# Patient Record
Sex: Male | Born: 1975 | Race: White | Hispanic: No | Marital: Single | Smoking: Current every day smoker
Health system: Southern US, Community
[De-identification: ages and names within clinical notes are randomized; demographics above are authoritative.]

---

## 2005-12-19 ENCOUNTER — Emergency Department: Payer: Self-pay | Admitting: Emergency Medicine

## 2009-10-25 ENCOUNTER — Emergency Department: Payer: Self-pay | Admitting: Emergency Medicine

## 2009-11-05 ENCOUNTER — Emergency Department: Payer: Self-pay | Admitting: Emergency Medicine

## 2011-08-13 ENCOUNTER — Emergency Department: Payer: Self-pay | Admitting: Emergency Medicine

## 2012-11-17 IMAGING — CR DG HAND COMPLETE 3+V*L*
1 series · 3 of 3 positions shown · non-contrast
Comparison: none

REASON FOR EXAM: trauma, pain, swelling, laceration.
COMMENTS:   LMP: (Male)

[Series 1: pa · 0.17mm/px · 3 of 3 slices shown]
[im 1/3]
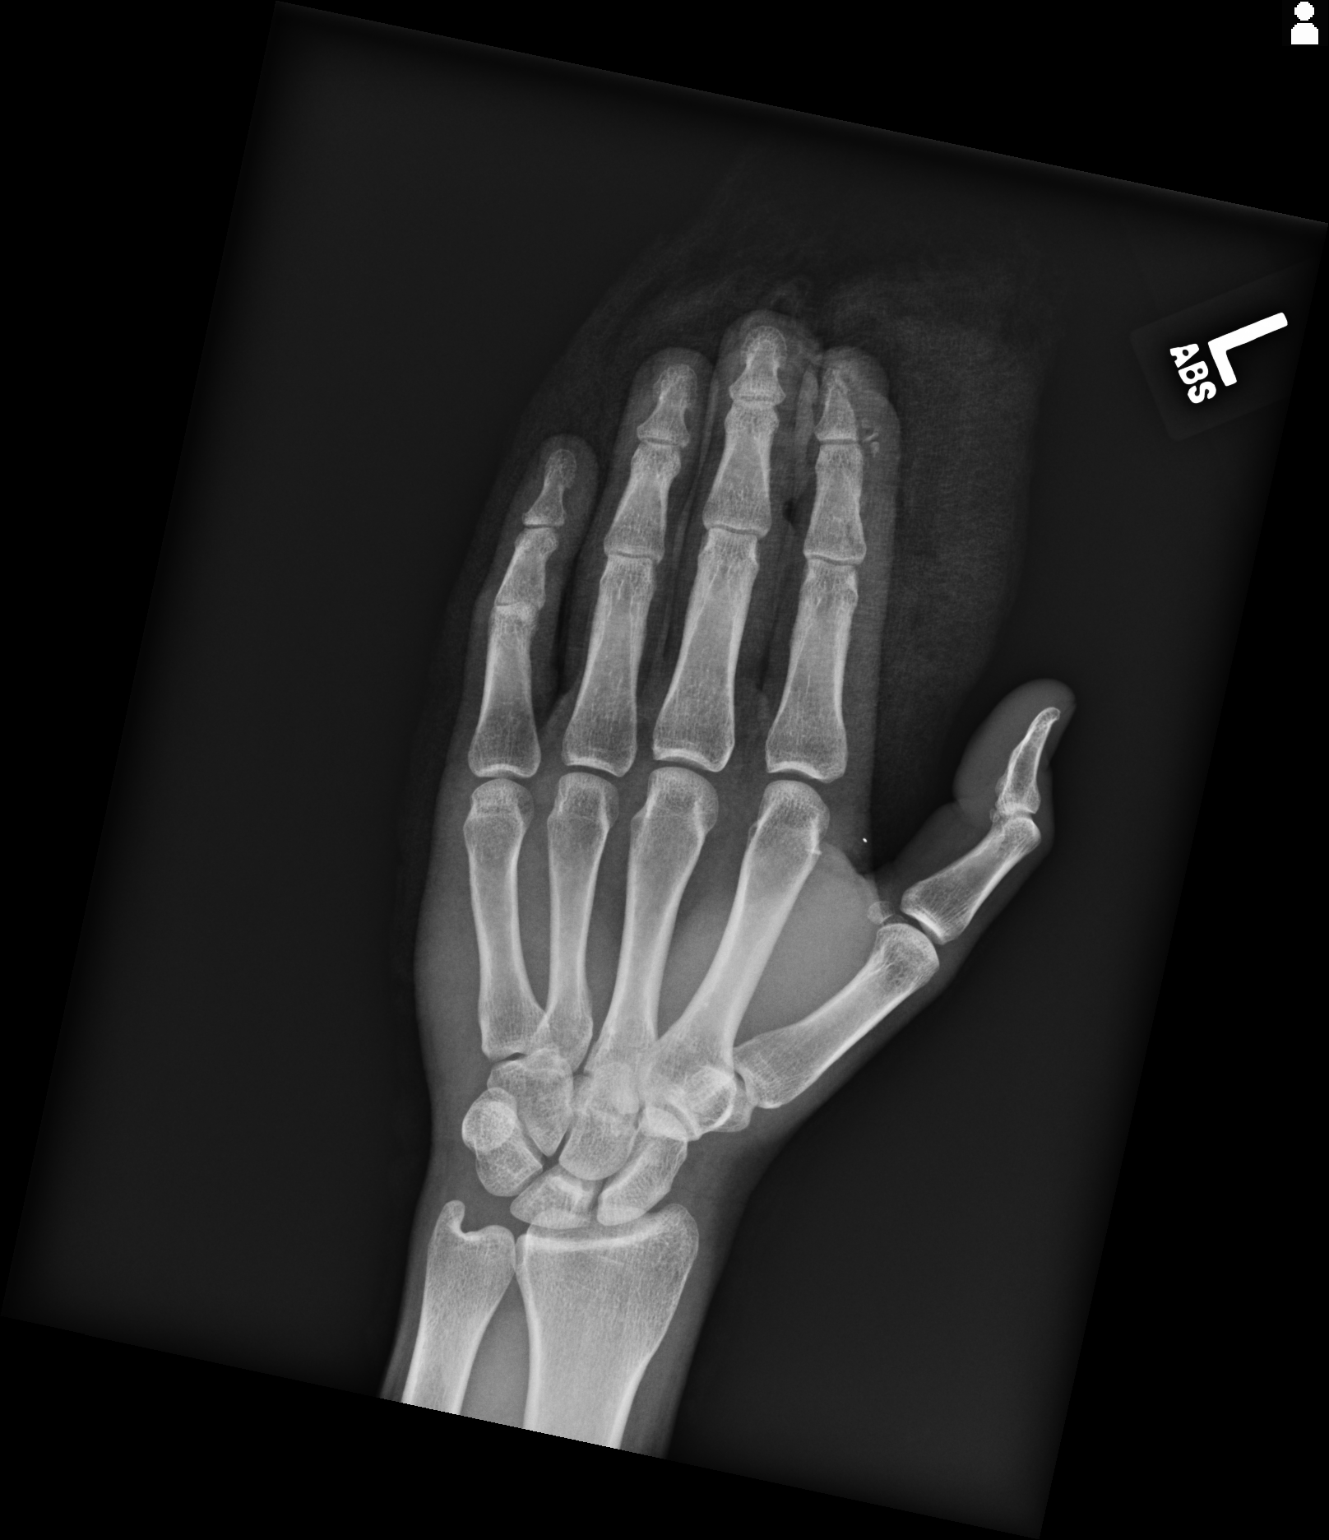
[im 2/3]
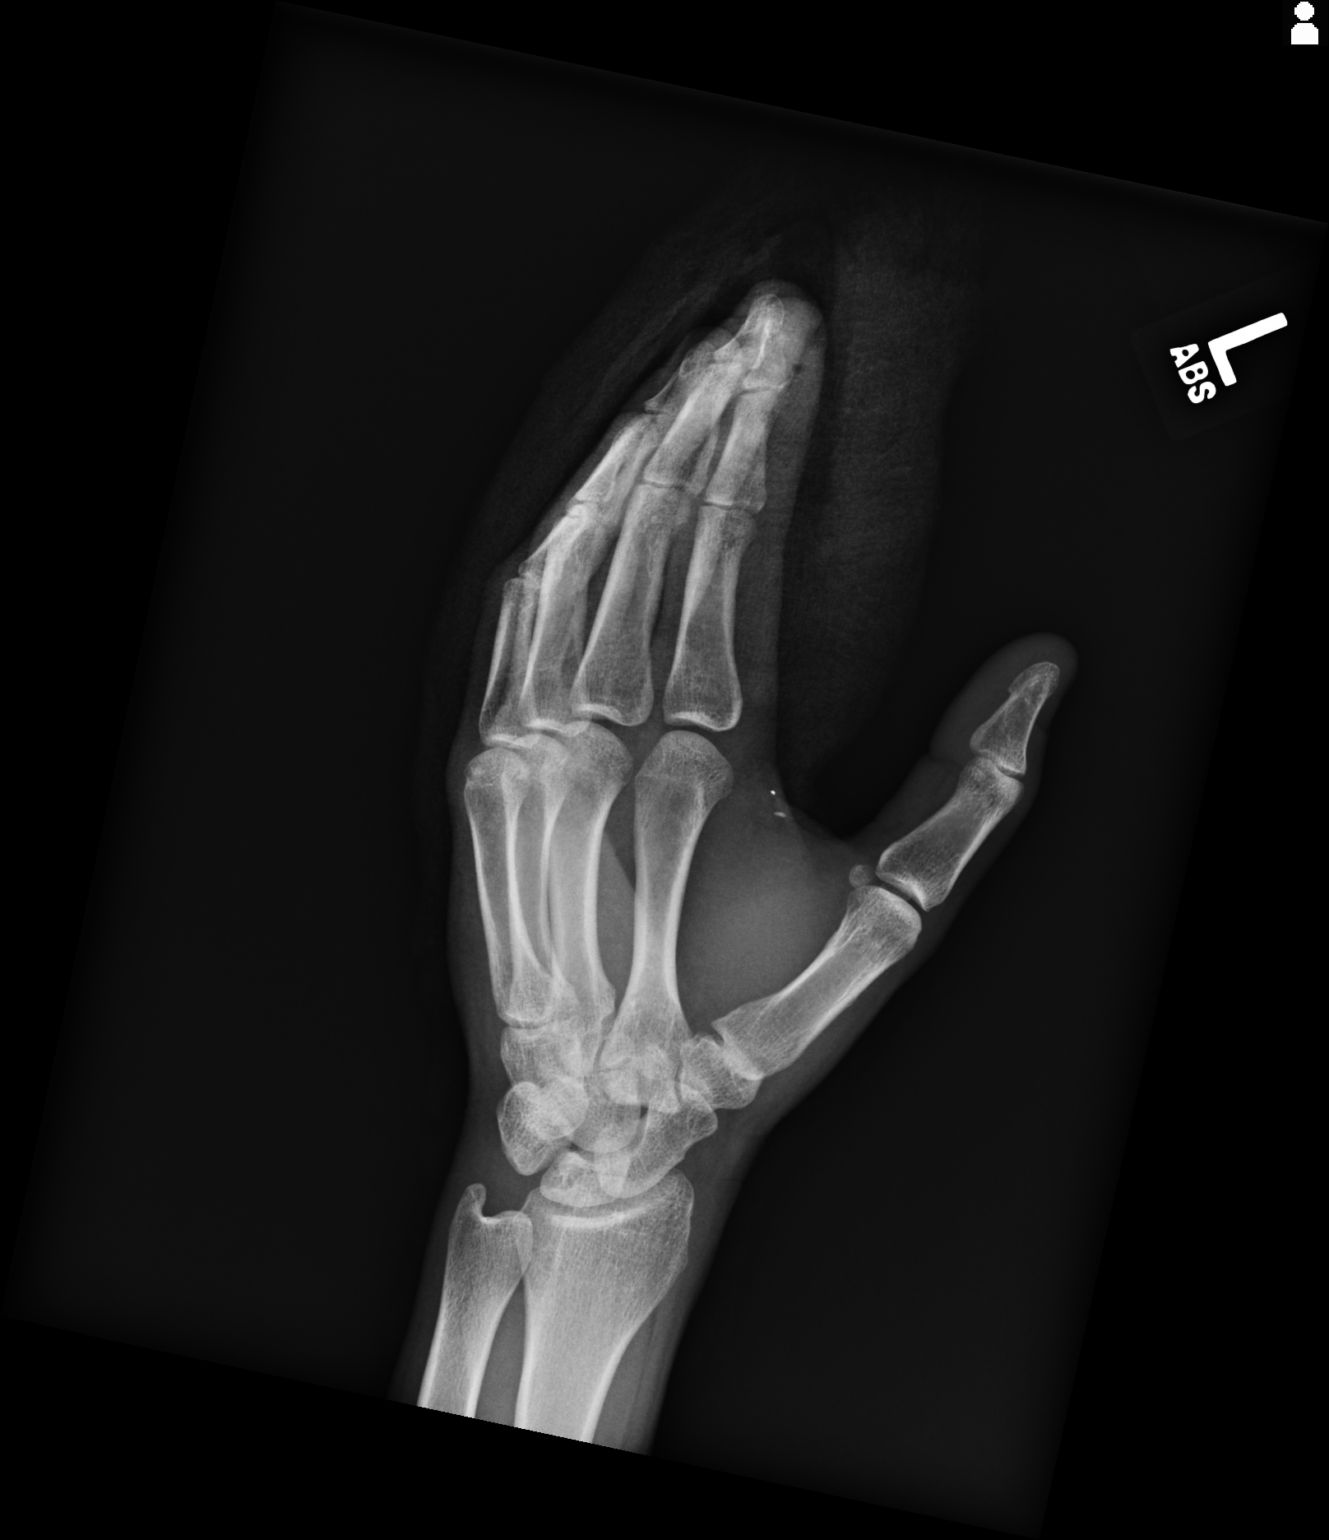
[im 3/3]
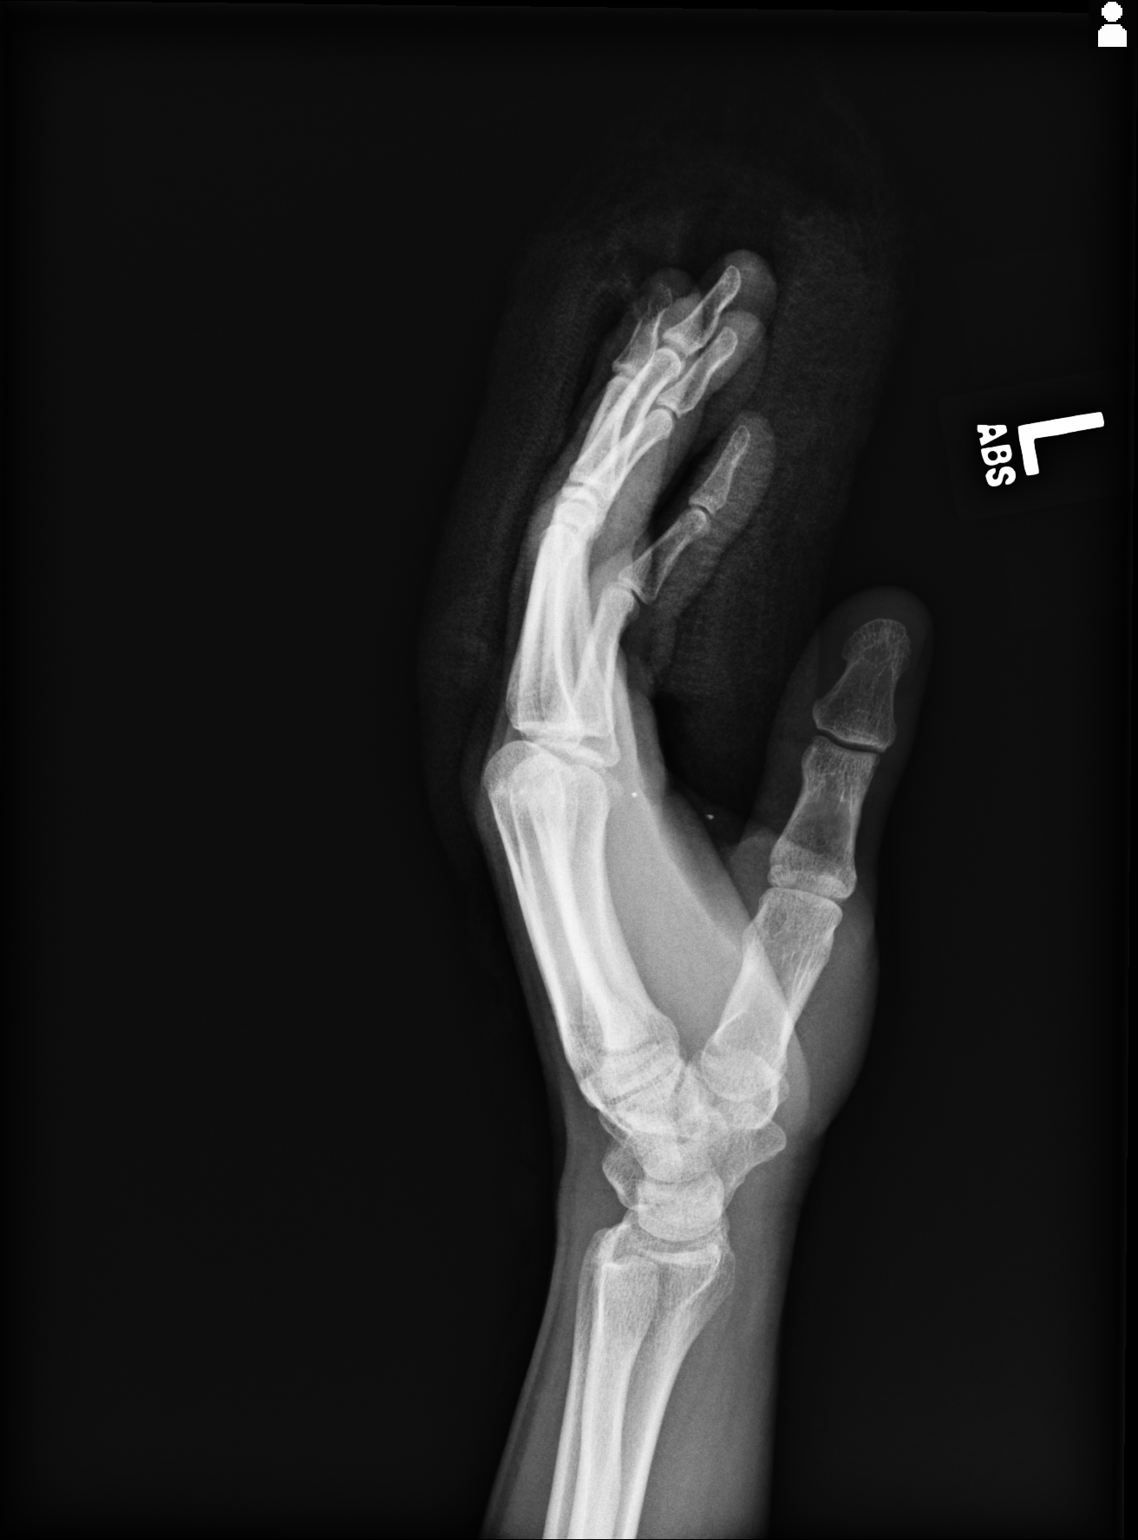

[3 of 3 positions shown; findings below may reference images not displayed]

PROCEDURE:     DXR - DXR HAND LT COMPLETE  W/OBLIQUES  - August 13, 2011  [DATE]

RESULT:     Three views of the left hand are submitted. There is gauze
wrapping the phalanges. There is partial amputation of a portion of the
distal phalanx of the left index finger. As best as can be determined the
other distal phalanges are intact. The proximal and middle phalanges are
intact.
IMPRESSION: The patient has sustained partial amputation of the distal
phalanx of the left index finger.

## 2013-06-20 ENCOUNTER — Emergency Department: Payer: Self-pay | Admitting: Emergency Medicine

## 2019-03-04 ENCOUNTER — Other Ambulatory Visit: Payer: Self-pay

## 2019-03-04 ENCOUNTER — Emergency Department
Admission: EM | Admit: 2019-03-04 | Discharge: 2019-03-04 | Disposition: A | Discharge status: Home / Self Care | Payer: Self-pay | Attending: Student in an Organized Health Care Education/Training Program | Admitting: Student in an Organized Health Care Education/Training Program

## 2019-03-04 DIAGNOSIS — Y999 Unspecified external cause status: Secondary | ICD-10-CM | POA: Insufficient documentation

## 2019-03-04 DIAGNOSIS — Y939 Activity, unspecified: Secondary | ICD-10-CM | POA: Insufficient documentation

## 2019-03-04 DIAGNOSIS — S81811A Laceration without foreign body, right lower leg, initial encounter: Secondary | ICD-10-CM | POA: Insufficient documentation

## 2019-03-04 DIAGNOSIS — F17210 Nicotine dependence, cigarettes, uncomplicated: Secondary | ICD-10-CM | POA: Insufficient documentation

## 2019-03-04 DIAGNOSIS — Y929 Unspecified place or not applicable: Secondary | ICD-10-CM | POA: Insufficient documentation

## 2019-03-04 DIAGNOSIS — W268XXA Contact with other sharp object(s), not elsewhere classified, initial encounter: Secondary | ICD-10-CM | POA: Insufficient documentation

## 2019-03-04 MED ORDER — LIDOCAINE HCL (PF) 1 % IJ SOLN
INTRAMUSCULAR | Status: AC
  Filled 2019-03-04: qty 5

## 2019-03-04 MED ORDER — BACITRACIN-NEOMYCIN-POLYMYXIN 400-5-5000 EX OINT
TOPICAL_OINTMENT | Freq: Once | CUTANEOUS | Status: AC
  Administered 2019-03-04: 1 via TOPICAL
  Filled 2019-03-04: qty 1

## 2019-03-04 MED ORDER — LIDOCAINE HCL (PF) 1 % IJ SOLN
5.0000 mL | Freq: Once | INTRAMUSCULAR | Status: DC
Start: 2019-03-04 — End: 2019-03-04
  Filled 2019-03-04: qty 5

## 2019-03-04 MED ORDER — TRAMADOL HCL 50 MG PO TABS: 50.0000 mg | ORAL_TABLET | Freq: Four times a day (QID) | ORAL | 0 refills | Status: AC | PRN

## 2019-03-04 NOTE — ED Notes (Signed)
Provider obtained the lidocaine.

## 2019-03-04 NOTE — ED Notes (Signed)
Patient right calf cleaned. And dressed with 4x4 and cling. Discharge instructions given. Ambulated out of ed.

## 2019-03-04 NOTE — ED Notes (Signed)
Pt states was cut in R calf by shrapnel at work; denies any visible shrapnel remaining at site; bleeding currently under control; states his last tetanus shot was within the last year. Pt able to move R leg/foot comfortably. States initially had some tingling in foot but doesn't an longer. Pt given warm blanket. Pt had about an inch and a half long lac to calf; begins bleeding some when bandage holding pressure loosened to view. Gauze bandage reapplied. Pad placed under leg.

## 2019-03-04 NOTE — ED Provider Notes (Signed)
[facility] Emergency Department Provider Note   ____________________________________________   First MD Initiated Contact with Patient 03/04/19 1216     (approximate)  I have reviewed the triage vital signs and the nursing notes.   HISTORY  Chief Complaint Laceration    HPI Austin Norman is a 43 y.o. male patient presents a laceration to right lower leg second to a metal cut.  Patient states tetanus shot is up-to-date.  Bleeding is controlled with direct pressure.  Patient rates pain as a 3/10.  Patient described pain is "sore".         History reviewed. No pertinent past medical history.  There are no active problems to display for this patient.   History reviewed. No pertinent surgical history.  Prior to Admission medications   Medication Sig Start Date End Date Taking? Authorizing Provider  traMADol (ULTRAM) 50 MG tablet Take 1 tablet (50 mg total) by mouth every 6 (six) hours as needed. 03/04/19 03/03/20  Sable Feil, PA-C    Allergies Patient has no known allergies.  No family history on file.  Social History Social History   Tobacco Use  . Smoking status: Current Every Day Smoker  . Smokeless tobacco: Never Used  Substance Use Topics  . Alcohol use: Yes  . Drug use: Not Currently    Review of Systems Constitutional: No fever/chills Eyes: No visual changes. ENT: No sore throat. Cardiovascular: Denies chest pain. Respiratory: Denies shortness of breath. Gastrointestinal: No abdominal pain.  No nausea, no vomiting.  No diarrhea.  No constipation. Genitourinary: Negative for dysuria. Musculoskeletal: Negative for back pain. Skin: Negative for rash.  Right leg laceration Neurological: Negative for headaches, focal weakness or numbness.   ____________________________________________   PHYSICAL EXAM:  VITAL SIGNS: ED Triage Vitals  Enc Vitals Group     BP 03/04/19 1107 117/70     Pulse Rate 03/04/19 1107 62    Resp 03/04/19 1107 16     Temp 03/04/19 1107 97.9 F (36.6 C)     Temp Source 03/04/19 1107 Oral     SpO2 03/04/19 1107 97 %     Weight 03/04/19 1105 187 lb (84.8 kg)     Height 03/04/19 1105 6\' 2"  (1.88 m)     Head Circumference --      Peak Flow --      Pain Score 03/04/19 1105 3     Pain Loc --      Pain Edu? --      Excl. in [place]? --     Constitutional: Alert and oriented. Well appearing and in no acute distress. Cardiovascular: Normal rate, regular rhythm. Grossly normal heart sounds.  Good peripheral circulation. Respiratory: Normal respiratory effort.  No retractions. Lungs CTAB. Musculoskeletal: No lower extremity tenderness nor edema.  No joint effusions. Neurologic:  Normal speech and language. No gross focal neurologic deficits are appreciated. No gait instability. Skin: 2.5 cm laceration to right lower leg.   Psychiatric: Mood and affect are normal. Speech and behavior are normal.  ____________________________________________   LABS (all labs ordered are listed, but only abnormal results are displayed)  Labs Reviewed - No data to display ____________________________________________  EKG   ____________________________________________  RADIOLOGY  ED MD interpretation:    Official radiology report(s): No results found.  ____________________________________________   PROCEDURES  Procedure(s) performed (including Critical Care):  Marland KitchenMarland KitchenLaceration Repair  Date/Time: 03/04/2019 12:28 PM Performed by: Sable Feil, PA-C Authorized by: Sable Feil, PA-C   Consent:  Consent given by:  Patient   Risks discussed:  Infection, pain, poor cosmetic result and need for additional repair Anesthesia (see MAR for exact dosages):    Anesthesia method:  Local infiltration   Local anesthetic:  Lidocaine 1% w/o epi Laceration details:    Location:  Leg   Leg location:  R lower leg   Length (cm):  2.5 Repair type:    Repair type:  Simple Pre-procedure  details:    Preparation:  Patient was prepped and draped in usual sterile fashion Treatment:    Area cleansed with:  Betadine and saline   Amount of cleaning:  Standard Skin repair:    Repair method:  Sutures   Suture size:  3-0   Suture material:  Nylon   Number of sutures:  7 Approximation:    Approximation:  Close Post-procedure details:    Dressing:  Antibiotic ointment and sterile dressing   Patient tolerance of procedure:  Tolerated well, no immediate complications     ____________________________________________   INITIAL IMPRESSION / ASSESSMENT AND PLAN / ED COURSE  As part of my medical decision making, I reviewed the following data within the electronic MEDICAL RECORD NUMBER         Austin Norman was evaluated in Emergency Department on 03/04/2019 for the symptoms described in the history of present illness. He was evaluated in the context of the global COVID-19 pandemic, which necessitated consideration that the patient might be at risk for infection with the SARS-CoV-2 virus that causes COVID-19. Institutional protocols and algorithms that pertain to the evaluation of patients at risk for COVID-19 are in a state of rapid change based on information released by regulatory bodies including the CDC and federal and state organizations. These policies and algorithms were followed during the patient's care in the ED.  Patient presents with laceration right lower leg.  See procedure note for closure.  Patient given discharge care instruction advised take medication as directed.  Patient return back in 10 days for suture removal.      ____________________________________________   FINAL CLINICAL IMPRESSION(S) / ED DIAGNOSES  Final diagnoses:  Laceration of right lower extremity, initial encounter     ED Discharge Orders         Ordered    traMADol (ULTRAM) 50 MG tablet  Every 6 hours PRN     03/04/19 1242           Note:  This document was prepared using  Dragon voice recognition software and may include unintentional dictation errors.    Joni ReiningSmith, Niam Nepomuceno K, PA-C 03/04/19 1246    Willy Eddyobinson, Patrick, MD 03/04/19 [phone]

## 2019-03-04 NOTE — ED Triage Notes (Signed)
Pt states he cut his left LE with a piece of sheet metal today. Bleeding is controlled.

## 2019-03-29 ENCOUNTER — Other Ambulatory Visit: Payer: Self-pay

## 2019-03-29 ENCOUNTER — Emergency Department
Admission: EM | Admit: 2019-03-29 | Discharge: 2019-03-29 | Discharge status: Against Medical Advice | Payer: Self-pay | Attending: Emergency Medicine | Admitting: Emergency Medicine

## 2019-03-29 DIAGNOSIS — F1193 Opioid use, unspecified with withdrawal: Secondary | ICD-10-CM

## 2019-03-29 DIAGNOSIS — Z79899 Other long term (current) drug therapy: Secondary | ICD-10-CM | POA: Insufficient documentation

## 2019-03-29 DIAGNOSIS — F17210 Nicotine dependence, cigarettes, uncomplicated: Secondary | ICD-10-CM | POA: Insufficient documentation

## 2019-03-29 DIAGNOSIS — F1123 Opioid dependence with withdrawal: Secondary | ICD-10-CM | POA: Insufficient documentation

## 2019-03-29 LAB — URINE DRUG SCREEN, QUALITATIVE (ARMC ONLY)
Amphetamines, Ur Screen: NOT DETECTED
Barbiturates, Ur Screen: NOT DETECTED
Benzodiazepine, Ur Scrn: NOT DETECTED
Cannabinoid 50 Ng, Ur ~~LOC~~: NOT DETECTED
Cocaine Metabolite,Ur ~~LOC~~: NOT DETECTED
MDMA (Ecstasy)Ur Screen: NOT DETECTED
Methadone Scn, Ur: NOT DETECTED
Opiate, Ur Screen: POSITIVE — AB
Phencyclidine (PCP) Ur S: NOT DETECTED
Tricyclic, Ur Screen: NOT DETECTED

## 2019-03-29 LAB — COMPREHENSIVE METABOLIC PANEL
ALT: 24 U/L (ref 0–44)
AST: 26 U/L (ref 15–41)
Albumin: 3.7 g/dL (ref 3.5–5.0)
Alkaline Phosphatase: 65 U/L (ref 38–126)
Anion gap: 10 (ref 5–15)
BUN: 9 mg/dL (ref 6–20)
CO2: 21 mmol/L — ABNORMAL LOW (ref 22–32)
Calcium: 8.8 mg/dL — ABNORMAL LOW (ref 8.9–10.3)
Chloride: 103 mmol/L (ref 98–111)
Creatinine, Ser: 0.93 mg/dL (ref 0.61–1.24)
GFR calc Af Amer: >60 mL/min (ref >60)
GFR calc non Af Amer: >60 mL/min (ref >60)
Glucose, Bld: 115 mg/dL — ABNORMAL HIGH (ref 70–99)
Potassium: 3.7 mmol/L (ref 3.5–5.1)
Sodium: 134 mmol/L — ABNORMAL LOW (ref 135–145)
Total Bilirubin: 0.2 mg/dL — ABNORMAL LOW (ref 0.3–1.2)
Total Protein: 6.5 g/dL (ref 6.5–8.1)

## 2019-03-29 LAB — CBC WITH DIFFERENTIAL/PLATELET
Abs Immature Granulocytes: 0.03 10*3/uL (ref 0.00–0.07)
Basophils Absolute: 0.1 10*3/uL (ref 0.0–0.1)
Basophils Relative: 1 %
Eosinophils Absolute: 0.2 10*3/uL (ref 0.0–0.5)
Eosinophils Relative: 2 %
HCT: 39.6 % (ref 39.0–52.0)
Hemoglobin: 13.1 g/dL (ref 13.0–17.0)
Immature Granulocytes: 0 %
Lymphocytes Relative: 22 %
Lymphs Abs: 2.3 10*3/uL (ref 0.7–4.0)
MCH: 28.9 pg (ref 26.0–34.0)
MCHC: 33.1 g/dL (ref 30.0–36.0)
MCV: 87.4 fL (ref 80.0–100.0)
Monocytes Absolute: 0.7 10*3/uL (ref 0.1–1.0)
Monocytes Relative: 7 %
Neutro Abs: 7 10*3/uL (ref 1.7–7.7)
Neutrophils Relative %: 68 %
Platelets: 189 10*3/uL (ref 150–400)
RBC: 4.53 MIL/uL (ref 4.22–5.81)
RDW: 12.8 % (ref 11.5–15.5)
WBC: 10.3 10*3/uL (ref 4.0–10.5)
nRBC: 0 % (ref 0.0–0.2)

## 2019-03-29 LAB — ETHANOL: Alcohol, Ethyl (B): 47 mg/dL — ABNORMAL HIGH (ref <10)

## 2019-03-29 MED ORDER — ONDANSETRON HCL 4 MG/2ML IJ SOLN
4.0000 mg | Freq: Once | INTRAMUSCULAR | Status: AC
  Administered 2019-03-29: 4 mg via INTRAVENOUS
  Filled 2019-03-29: qty 2

## 2019-03-29 MED ORDER — KETOROLAC TROMETHAMINE 30 MG/ML IJ SOLN
15.0000 mg | Freq: Once | INTRAMUSCULAR | Status: AC
  Administered 2019-03-29: 15 mg via INTRAVENOUS
  Filled 2019-03-29: qty 1

## 2019-03-29 MED ORDER — ACETAMINOPHEN 500 MG PO TABS
1000.0000 mg | ORAL_TABLET | Freq: Once | ORAL | Status: AC
  Administered 2019-03-29: 1000 mg via ORAL
  Filled 2019-03-29: qty 2

## 2019-03-29 NOTE — ED Notes (Signed)
Pt requesting help detoxing from heroin. Reports last used 12 hours ago and that he snorts the drug. Pt is writhing around on the bed and yelling loudly at staff. Pt encouraged to not yell and to try to lay still so that labs and EKG can be done.

## 2019-03-29 NOTE — ED Triage Notes (Signed)
Pt to the er for heroin withdrawal. Symptoms started 2 to 3 hours ago. Past last snorted heroin 12 hours ago. Pt states he has been using daily for 6 months. Pt states he wants help tonight. Pt ambulatory and controlled.

## 2019-03-29 NOTE — ED Notes (Signed)
Pt requesting to leave. AMA form obtained and patient signed and placed on chart.

## 2019-03-29 NOTE — ED Notes (Signed)
Pt is rolling around on the bed and yelling now. Behavior changed once in room. Pt is kicking legs and keeps repeating help me. Pt reassured as staff complete initial tasks and testing.

## 2019-03-29 NOTE — ED Provider Notes (Signed)
[facility] Emergency Department Provider Note  ____________________________________________  Time seen: Approximately 3:09 AM  I have reviewed the triage vital signs and the nursing notes.   HISTORY  Chief Complaint Drug withdrawl   HPI Austin Norman is a 43 y.o. male with a history of heroin abuse who presents for evaluation of withdrawal symptoms.  Patient reports that he has been snorting heroin every day for the last 6 months.  His last dose was 12 hours ago.  Patient wishes to detox.  He denies suicidal homicidal ideation.  Is complaining of severe diffuse body pain.  He has had nausea and one episode of vomiting.  No diarrhea.  No hallucinations.  No alcohol abuse or any other drug use.   PMH Heroin abuse  Prior to Admission medications   Medication Sig Start Date End Date Taking? Authorizing Provider  traMADol (ULTRAM) 50 MG tablet Take 1 tablet (50 mg total) by mouth every 6 (six) hours as needed. 03/04/19 03/03/20  Sable Feil, PA-C    Allergies Patient has no known allergies.  No family history on file.  Social History Social History   Tobacco Use  . Smoking status: Current Every Day Smoker  . Smokeless tobacco: Never Used  Substance Use Topics  . Alcohol use: Yes  . Drug use: Yes    Comment: heroin    Review of Systems  Constitutional: Negative for fever. + Generalized body pain Eyes: Negative for visual changes. ENT: Negative for sore throat. Neck: No neck pain  Cardiovascular: Negative for chest pain. Respiratory: Negative for shortness of breath. Gastrointestinal: Negative for abdominal pain,  Diarrhea. + N/V Genitourinary: Negative for dysuria. Musculoskeletal: Negative for back pain. Skin: Negative for rash. Neurological: Negative for headaches, weakness or numbness. Psych: No SI or HI  ____________________________________________   PHYSICAL EXAM:  VITAL SIGNS: ED Triage Vitals  Enc Vitals Group     BP  03/29/19 0239 126/86     Pulse Rate 03/29/19 0241 83     Resp --      Temp --      Temp src --      SpO2 03/29/19 0241 96 %     Weight 03/29/19 0242 185 lb (83.9 kg)     Height 03/29/19 0242 6\' 2"  (1.88 m)     Head Circumference --      Peak Flow --      Pain Score 03/29/19 0241 10     Pain Loc --      Pain Edu? --      Excl. in [place]? --     Constitutional: Alert and oriented, screaming in pain. HEENT:      Head: Normocephalic and atraumatic.         Eyes: Conjunctivae are normal. Sclera is non-icteric.       Mouth/Throat: Mucous membranes are moist.       Neck: Supple with no signs of meningismus. Cardiovascular: Regular rate and rhythm. No murmurs, gallops, or rubs. 2+ symmetrical distal pulses are present in all extremities. No JVD. Respiratory: Normal respiratory effort. Lungs are clear to auscultation bilaterally. No wheezes, crackles, or rhonchi.  Gastrointestinal: Soft, non tender, and non distended with positive bowel sounds. No rebound or guarding. Musculoskeletal: Nontender with normal range of motion in all extremities. No edema, cyanosis, or erythema of extremities. Neurologic: Normal speech and language. Face is symmetric. Moving all extremities. No gross focal neurologic deficits are appreciated. Skin: Skin is warm, dry and intact. No  rash noted. Psychiatric: Mood and affect are normal. Speech and behavior are normal.  ____________________________________________   LABS (all labs ordered are listed, but only abnormal results are displayed)  Labs Reviewed  COMPREHENSIVE METABOLIC PANEL - Abnormal; Notable for the following components:      Result Value   Sodium 134 (*)    CO2 21 (*)    Glucose, Bld 115 (*)    Calcium 8.8 (*)    Total Bilirubin 0.2 (*)    All other components within normal limits  ETHANOL - Abnormal; Notable for the following components:   Alcohol, Ethyl (B) 47 (*)    All other components within normal limits  CBC WITH DIFFERENTIAL/PLATELET   URINE DRUG SCREEN, QUALITATIVE ([facility] ONLY)   ____________________________________________  EKG  ED ECG REPORT I, Nita Sickle, the attending physician, personally viewed and interpreted this ECG.  Normal sinus rhythm, rate of 73, occasional PACs, normal intervals, normal axis, no ST elevations or depressions. ____________________________________________  RADIOLOGY  none  ____________________________________________   PROCEDURES  Procedure(s) performed: None Procedures Critical Care performed:  None ____________________________________________   INITIAL IMPRESSION / ASSESSMENT AND PLAN / ED COURSE  43 y.o. male with a history of heroin abuse who presents for evaluation of withdrawal symptoms after last using heroin 12 hours ago.  Patient is complain of generalized body pain, nausea and vomiting.  Vitals are within normal limits with no tachycardia, no hypertension, no fever.  Will give Toradol and Tylenol for body pain, Zofran for nausea, start patient on COWS and consult TTS for detox.  Patient has no suicidal or homicidal ideation.    _________________________ 3:31 AM on 03/29/2019 -----------------------------------------  Patient requesting narcotics to treat his pain. Patient was given Toradol and Tylenol.  I explained to the patient he would not receive IV narcotics.  At that time patient decided to leave and no longer wanted detox.  Patient left ambulatory with no difficulty breathing, with steady gait and a GCS of 15.  His labs showed no significant electrolyte abnormalities or dehydration.    As part of my medical decision making, I reviewed the following data within the electronic MEDICAL RECORD NUMBER Nursing notes reviewed and incorporated, Labs reviewed , EKG interpreted , Old chart reviewed, Notes from prior ED visits and [place] Controlled Substance Database   Patient was evaluated in Emergency Department today for the symptoms described in the history of present  illness. Patient was evaluated in the context of the global COVID-19 pandemic, which necessitated consideration that the patient might be at risk for infection with the SARS-CoV-2 virus that causes COVID-19. Institutional protocols and algorithms that pertain to the evaluation of patients at risk for COVID-19 are in a state of rapid change based on information released by regulatory bodies including the CDC and federal and state organizations. These policies and algorithms were followed during the patient's care in the ED.   ____________________________________________   FINAL CLINICAL IMPRESSION(S) / ED DIAGNOSES   Final diagnoses:  Heroin withdrawal (HCC)      NEW MEDICATIONS STARTED DURING THIS VISIT:  ED Discharge Orders    None       Note:  This document was prepared using Dragon voice recognition software and may include unintentional dictation errors.    Don Perking, [place], MD 03/29/19 [phone]
